# Patient Record
Sex: Male | Born: 1985 | Race: Black or African American | Hispanic: No | Marital: Single
Health system: Southern US, Community
[De-identification: ages and names within clinical notes are randomized; demographics above are authoritative.]

---
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
## 2017-04-24 ENCOUNTER — Emergency Department (HOSPITAL_COMMUNITY)
Admission: EM | Admit: 2017-04-24 | Discharge: 2017-04-24 | Disposition: A | Discharge status: Home / Self Care | Payer: No Typology Code available for payment source | Attending: Emergency Medicine | Admitting: Emergency Medicine

## 2017-04-24 ENCOUNTER — Encounter (HOSPITAL_COMMUNITY): Payer: Self-pay | Admitting: Emergency Medicine

## 2017-04-24 ENCOUNTER — Emergency Department (HOSPITAL_COMMUNITY): Payer: No Typology Code available for payment source

## 2017-04-24 DIAGNOSIS — Y9389 Activity, other specified: Secondary | ICD-10-CM | POA: Insufficient documentation

## 2017-04-24 DIAGNOSIS — Y999 Unspecified external cause status: Secondary | ICD-10-CM | POA: Diagnosis not present

## 2017-04-24 DIAGNOSIS — S161XXA Strain of muscle, fascia and tendon at neck level, initial encounter: Secondary | ICD-10-CM | POA: Diagnosis not present

## 2017-04-24 DIAGNOSIS — Y92410 Unspecified street and highway as the place of occurrence of the external cause: Secondary | ICD-10-CM | POA: Diagnosis not present

## 2017-04-24 DIAGNOSIS — S199XXA Unspecified injury of neck, initial encounter: Secondary | ICD-10-CM | POA: Diagnosis present

## 2017-04-24 MED ORDER — CYCLOBENZAPRINE HCL 10 MG PO TABS: 10.0000 mg | ORAL_TABLET | Freq: Two times a day (BID) | ORAL | 0 refills | Status: DC | PRN

## 2017-04-24 MED ORDER — DICLOFENAC SODIUM 50 MG PO TBEC: 50.0000 mg | DELAYED_RELEASE_TABLET | Freq: Two times a day (BID) | ORAL | 0 refills | Status: DC
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
## 2017-04-24 NOTE — Discharge Instructions (Signed)
Take the medication as directed. Do not take the muscle relaxant if driving as it will make you sleepy. Return as needed for any problems.

## 2017-04-25 ENCOUNTER — Emergency Department (HOSPITAL_COMMUNITY)
Admission: EM | Admit: 2017-04-25 | Discharge: 2017-04-25 | Disposition: A | Discharge status: Home / Self Care | Payer: Self-pay | Attending: Emergency Medicine | Admitting: Emergency Medicine

## 2017-04-25 ENCOUNTER — Encounter (HOSPITAL_COMMUNITY): Payer: Self-pay | Admitting: Emergency Medicine

## 2017-04-25 DIAGNOSIS — S56912A Strain of unspecified muscles, fascia and tendons at forearm level, left arm, initial encounter: Secondary | ICD-10-CM | POA: Insufficient documentation

## 2017-04-25 DIAGNOSIS — Y999 Unspecified external cause status: Secondary | ICD-10-CM | POA: Insufficient documentation

## 2017-04-25 DIAGNOSIS — S29012A Strain of muscle and tendon of back wall of thorax, initial encounter: Secondary | ICD-10-CM | POA: Insufficient documentation

## 2017-04-25 DIAGNOSIS — Y92410 Unspecified street and highway as the place of occurrence of the external cause: Secondary | ICD-10-CM | POA: Insufficient documentation

## 2017-04-25 DIAGNOSIS — I10 Essential (primary) hypertension: Secondary | ICD-10-CM | POA: Insufficient documentation

## 2017-04-25 DIAGNOSIS — S56911A Strain of unspecified muscles, fascia and tendons at forearm level, right arm, initial encounter: Secondary | ICD-10-CM | POA: Insufficient documentation

## 2017-04-25 DIAGNOSIS — Y939 Activity, unspecified: Secondary | ICD-10-CM | POA: Insufficient documentation

## 2017-04-25 DIAGNOSIS — T07XXXA Unspecified multiple injuries, initial encounter: Secondary | ICD-10-CM

## 2017-04-25 MED ORDER — IBUPROFEN 800 MG PO TABS
800.0000 mg | ORAL_TABLET | Freq: Once | ORAL | Status: AC
  Administered 2017-04-25: 800 mg via ORAL
  Filled 2017-04-25: qty 1

## 2017-04-25 NOTE — Discharge Instructions (Signed)
Please drink plenty of fluids.  Take ibuprofen as needed for muscle aches.  Have your blood pressure rechecked with a primary care doctor next week.
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
## 2017-04-27 ENCOUNTER — Emergency Department (HOSPITAL_COMMUNITY)
Admission: EM | Admit: 2017-04-27 | Discharge: 2017-04-27 | Disposition: A | Discharge status: Home / Self Care | Payer: Self-pay | Attending: Emergency Medicine | Admitting: Emergency Medicine

## 2017-04-27 ENCOUNTER — Encounter (HOSPITAL_COMMUNITY): Payer: Self-pay | Admitting: Emergency Medicine

## 2017-04-27 DIAGNOSIS — M542 Cervicalgia: Secondary | ICD-10-CM | POA: Insufficient documentation

## 2017-04-27 DIAGNOSIS — M549 Dorsalgia, unspecified: Secondary | ICD-10-CM | POA: Insufficient documentation

## 2017-04-27 DIAGNOSIS — M25531 Pain in right wrist: Secondary | ICD-10-CM | POA: Insufficient documentation

## 2017-04-27 NOTE — ED Triage Notes (Signed)
[facility]. Patient c/o muscular pain to left side of neck with movement and c/o pain to right wrist. A&OX4 in triage

## 2017-04-27 NOTE — ED Notes (Signed)
Patient given discharge instruction, verbalized understand. Patient ambulatory out of the department.  
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
## 2017-11-13 ENCOUNTER — Encounter (HOSPITAL_COMMUNITY): Payer: Self-pay | Admitting: Emergency Medicine

## 2017-11-13 ENCOUNTER — Emergency Department (HOSPITAL_COMMUNITY): Payer: Self-pay

## 2017-11-13 ENCOUNTER — Other Ambulatory Visit: Payer: Self-pay

## 2017-11-13 ENCOUNTER — Emergency Department (HOSPITAL_COMMUNITY)
Admission: EM | Admit: 2017-11-13 | Discharge: 2017-11-13 | Disposition: A | Discharge status: Home / Self Care | Payer: Self-pay | Attending: Emergency Medicine | Admitting: Emergency Medicine

## 2017-11-13 DIAGNOSIS — Y929 Unspecified place or not applicable: Secondary | ICD-10-CM | POA: Insufficient documentation

## 2017-11-13 DIAGNOSIS — S96901A Unspecified injury of unspecified muscle and tendon at ankle and foot level, right foot, initial encounter: Secondary | ICD-10-CM | POA: Insufficient documentation

## 2017-11-13 DIAGNOSIS — W1789XA Other fall from one level to another, initial encounter: Secondary | ICD-10-CM | POA: Insufficient documentation

## 2017-11-13 DIAGNOSIS — Y9389 Activity, other specified: Secondary | ICD-10-CM | POA: Insufficient documentation

## 2017-11-13 DIAGNOSIS — Y998 Other external cause status: Secondary | ICD-10-CM | POA: Insufficient documentation

## 2017-11-13 DIAGNOSIS — Z79899 Other long term (current) drug therapy: Secondary | ICD-10-CM | POA: Insufficient documentation

## 2017-11-13 DIAGNOSIS — S66801A Unspecified injury of other specified muscles, fascia and tendons at wrist and hand level, right hand, initial encounter: Secondary | ICD-10-CM

## 2017-11-13 MED ORDER — IBUPROFEN 600 MG PO TABS: 600.0000 mg | ORAL_TABLET | Freq: Four times a day (QID) | ORAL | 0 refills | Status: DC

## 2017-11-13 MED ORDER — ONDANSETRON HCL 4 MG PO TABS
4.0000 mg | ORAL_TABLET | Freq: Once | ORAL | Status: AC
  Administered 2017-11-13: 4 mg via ORAL
  Filled 2017-11-13: qty 1

## 2017-11-13 MED ORDER — HYDROCODONE-ACETAMINOPHEN 5-325 MG PO TABS
2.0000 | ORAL_TABLET | Freq: Once | ORAL | Status: AC
  Administered 2017-11-13: 2 via ORAL
  Filled 2017-11-13: qty 2

## 2017-11-13 MED ORDER — HYDROCODONE-ACETAMINOPHEN 5-325 MG PO TABS: 1.0000 | ORAL_TABLET | ORAL | 0 refills | Status: AC | PRN

## 2017-11-13 MED ORDER — IBUPROFEN 800 MG PO TABS
800.0000 mg | ORAL_TABLET | Freq: Once | ORAL | Status: AC
  Administered 2017-11-13: 800 mg via ORAL
  Filled 2017-11-13: qty 1

## 2017-11-13 NOTE — Discharge Instructions (Signed)
Your x-ray questions ligament injury of the right ankle.  You have a joint effusion (fluid in the joint) and there is question of a deformity in the back of your ankle that may be the way your growth plates came together, or may be a subtle fracture.  Please use your ankle stirrup splint until seen by the orthopedic specialist.  You do not have to sleep in this device.  Please keep your foot elevated above your waist when you are sitting and above your heart when you are lying down to control the swelling.  Please use ibuprofen with breakfast, lunch, dinner, and at bedtime for swelling and inflammation.  May use hydrocodone for more severe pain.This medication may cause drowsiness. Please do not drink, drive, or participate in activity that requires concentration while taking this medication.  Please use your crutches until you can safely apply weight to your right lower extremity.  Please see Dr. Romeo AppleHarrison as soon as possible for orthopedic evaluation of your right ankle.

## 2017-11-13 NOTE — ED Triage Notes (Signed)
Pt c/o right ankle pain after jumping a fence x one week ago.

## 2017-11-13 NOTE — ED Provider Notes (Signed)
Bryan Morris   CSN: [id number] Arrival date & time: 11/13/17  1931     History   Chief Complaint Chief Complaint  Patient presents with  . Ankle Pain    HPI Bryan Morris is a 32 y.o. male.  Patient is a 32 year old male who presents to the emergency department with a complaint of right ankle pain and swelling.  The patient states that on Thursday, March 14 he jumped over a fence, landed wrong, and has been having pain and swelling since that time.  The patient states that he was evaluated by EMS, and told that he may have a sprain, but nothing significant going on.  The patient states that since that time he has been having more and more and more swelling, and now he cannot put weight on it without severe pain.  No other injury reported at this time.  Patient denies knee or hip pain on the right.      History reviewed. No pertinent past medical history.  There are no active problems to display for this patient.   History reviewed. No pertinent surgical history.     Home Medications    Prior to Admission medications   Medication Sig Start Date End Date Taking? Authorizing Provider  cyclobenzaprine (FLEXERIL) 10 MG tablet Take 1 tablet (10 mg total) by mouth 2 (two) times daily as needed for muscle spasms. 04/24/17   Bryan Napoleon, Bryan Morris  diclofenac (VOLTAREN) 50 MG EC tablet Take 1 tablet (50 mg total) by mouth 2 (two) times daily. 04/24/17   Bryan Napoleon, Bryan Morris    Family History History reviewed. No pertinent family history.  Social History Social History   Tobacco Use  . Smoking status: Never Smoker  . Smokeless tobacco: Never Used  Substance Use Topics  . Alcohol use: Yes  . Drug use: Yes    Frequency: 2.0 times per week    Types: Marijuana     Allergies   Patient has no known allergies.   Review of Systems Review of Systems  Constitutional: Negative for activity change.       All ROS Neg except as noted in HPI  HENT:  Negative for nosebleeds.   Eyes: Negative for photophobia and discharge.  Respiratory: Negative for cough, shortness of breath and wheezing.   Cardiovascular: Negative for chest pain and palpitations.  Gastrointestinal: Negative for abdominal pain and blood in stool.  Genitourinary: Negative for dysuria, frequency and hematuria.  Musculoskeletal: Positive for arthralgias. Negative for back pain and neck pain.       Ankle pain and swelling  Skin: Negative.   Neurological: Negative for dizziness, seizures and speech difficulty.  Psychiatric/Behavioral: Negative for confusion and hallucinations.     Physical Exam Updated Vital Signs BP 131/70   Pulse 87   Temp 98.6 F (37 C)   Resp 18   Ht 5\' 8"  (1.727 m)   Wt 98.9 kg (218 lb)   SpO2 100%   BMI 33.15 kg/m   Physical Exam  Musculoskeletal:       Right foot: There is decreased range of motion, tenderness and swelling. There is normal capillary refill.  There is full range of motion of the right hip and right knee.  There is no palpable deformity of the tibial area.  There is swelling of the right ankle.  There is bruising noted of the medial aspect of the right ankle.  There is tenderness to the dorsum of the foot  extending into the ankle, and there is tenderness to the posterior ankle.  The Achilles tendon is intact.  Capillary refill is less than 2 seconds.  Dorsalis pedis and posterior tibial pulses are 2+. Range of motion of the ankle is not adequately tested at this time due to pain and swelling.     ED Treatments / Results  Labs (all labs ordered are listed, but only abnormal results are displayed) Labs Reviewed - No data to display  EKG  EKG Interpretation None       Radiology Dg Ankle Complete Right  Result Date: 11/13/2017 CLINICAL DATA:  Jumped over a fence and landed wrong, with swelling, bruising and pain at medial malleolus, injury on 11/07/2017 EXAM: RIGHT ANKLE - COMPLETE 3+ VIEW COMPARISON:  None FINDINGS:  Diffuse soft tissue swelling RIGHT ankle greatest medially. Small rounded ossicle adjacent to tip of lateral malleolus, appears corticated/old. Widening of the medial joint line question ligamentous injury. Cortical irregularity a at the posterior margin of the distal tibia, question subtle posterior malleolar fracture versus developmental anomaly at site of closure of the distal tibial physeal plate. Small ankle joint effusion present. No additional fracture or dislocation. IMPRESSION: Soft tissue swelling with small ankle joint effusion. Widening of medial joint line question ligamentous injury. Questionable subtle fracture versus developmental anomaly at the posterior margin of the distal tibia. Electronically Signed   By: Ulyses SouthwardMark  Boles M.D.   On: 11/13/2017 20:05    Procedures Procedures (including critical care time)  Medications Ordered in ED Medications - No data to display   Initial Impression / Assessment and Plan / ED Course  I have reviewed the triage vital signs and the nursing notes.  Pertinent labs & imaging results that were available during my care of the patient were reviewed by me and considered in my medical decision making (see chart for details).       Final Clinical Impressions(s) / ED Diagnoses MDM  Patient sustained injury to the right ankle on March 14.  The patient states that he has been having continued pain and swelling since that time, and now he cannot put weight on that lower extremity without severe pain.  The x-ray shows soft tissue swelling with small joint effusion present.  There is some widening of the medial joint line with the question of ligamentous injury.  There is also question of a subtle fracture versus a developmental abnormality at the posterior margin of the distal tibia.  There are no neurovascular deficits appreciated at this time.  Patient fitted with an ankle stirrup splint and crutches.  I have discussed with him the importance of keeping  his ankle elevated above his waist when sitting and above his heart when lying down.  He will use ibuprofen 4 times daily and Norco every 6 hours as needed for more severe pain.  He is referred to Dr. Romeo AppleHarrison for orthopedic evaluation of this possible ligamentous injury and questionable posterior tibial fracture.  The patient is in agreement with this plan.   Final diagnoses:  Intermetacarpal ligament injury, right, initial encounter    ED Discharge Orders        Ordered    ibuprofen (ADVIL,MOTRIN) 600 MG tablet  4 times daily     11/13/17 2025    HYDROcodone-acetaminophen (NORCO/VICODIN) 5-325 MG tablet  Every 4 hours PRN     11/13/17 2025       Bryan Morris, Bryan Buch, Bryan Morris 11/13/17 2033    Bryan Morris, Bryan CowerJason, Bryan Morris 11/13/17 2225

## 2017-11-14 ENCOUNTER — Telehealth: Payer: Self-pay | Admitting: Orthopedic Surgery

## 2017-11-14 NOTE — Telephone Encounter (Signed)
Called back to patient; scheduled. Patient aware.

## 2017-11-14 NOTE — Telephone Encounter (Signed)
Patient called [facility] visit yesterday, 11/13/17 regarding problem of intrametacarpal ligament injury, right ankle. Please review and advise regarding scheduling appointment, based on current schedule. [phone]

## 2017-11-14 NOTE — Telephone Encounter (Signed)
Next open appt for new problem

## 2017-11-21 ENCOUNTER — Encounter: Payer: Self-pay | Admitting: Orthopedic Surgery

## 2017-11-21 ENCOUNTER — Ambulatory Visit (INDEPENDENT_AMBULATORY_CARE_PROVIDER_SITE_OTHER): Payer: Self-pay

## 2017-11-21 ENCOUNTER — Ambulatory Visit: Payer: Self-pay | Admitting: Orthopedic Surgery

## 2017-11-21 VITALS — BP 134/87 | HR 70 | Ht 68.0 in | Wt 215.0 lb

## 2017-11-21 DIAGNOSIS — M25571 Pain in right ankle and joints of right foot: Secondary | ICD-10-CM

## 2017-11-21 DIAGNOSIS — S93491A Sprain of other ligament of right ankle, initial encounter: Secondary | ICD-10-CM

## 2017-11-21 DIAGNOSIS — S93431A Sprain of tibiofibular ligament of right ankle, initial encounter: Secondary | ICD-10-CM

## 2017-11-21 MED ORDER — IBUPROFEN 600 MG PO TABS
600.0000 mg | ORAL_TABLET | Freq: Four times a day (QID) | ORAL | 0 refills | Status: AC
Start: 2017-11-21 — End: ?

## 2017-11-21 NOTE — Patient Instructions (Signed)
No weightbearing next 4 weeks and then start walking if having pain or problems call us back

## 2017-11-21 NOTE — Progress Notes (Signed)
NEW PATIENT OFFICE VISIT   Chief Complaint  Patient presents with  . Ankle Injury    jumped a fence twisted right ankle 11/07/17     32 year old male jumped over a fence twisted his ankle presents for evaluation  He complains of mild right ankle pain 14 days associated with swelling which is come down with ankle wrap and crutches       Review of Systems  Constitutional: Negative for fever.  Skin: Negative.   Neurological: Negative.      History reviewed. No pertinent past medical history. No history of hypertension or diabetes History reviewed. No pertinent surgical history. Denies any history of surgery History reviewed. No pertinent family history. Social History   Tobacco Use  . Smoking status: Never Smoker  . Smokeless tobacco: Never Used  Substance Use Topics  . Alcohol use: Yes  . Drug use: Yes    Frequency: 2.0 times per week    Types: Marijuana   No Known Allergies   Current Meds  Medication Sig  . HYDROcodone-acetaminophen (NORCO/VICODIN) 5-325 MG tablet Take 1 tablet by mouth every 4 (four) hours as needed.  Marland Kitchen. ibuprofen (ADVIL,MOTRIN) 600 MG tablet Take 1 tablet (600 mg total) by mouth 4 (four) times daily.    BP 134/87   Pulse 70   Ht 5\' 8"  (1.727 m)   Wt 215 lb (97.5 kg)   BMI 32.69 kg/m   Physical Exam  Constitutional: He is oriented to person, place, and time. He appears well-developed and well-nourished.  Vital signs have been reviewed and are stable. Gen. appearance the patient is well-developed and well-nourished with normal grooming and hygiene.   Neurological: He is alert and oriented to person, place, and time.  Skin: Skin is warm and dry. No erythema.  Psychiatric: He has a normal mood and affect.  Vitals reviewed.   Right Ankle Exam   Tenderness  Right ankle tenderness location: Tenderness over the deltoid ligament anterior talofibular ligament and anterior tibia fibular ligament. Swelling: mild  Range of Motion   Dorsiflexion: normal  Plantar flexion: normal   Muscle Strength  Dorsiflexion:  5/5 Plantar flexion:  5/5  Tests  Anterior drawer: trace (External rotation test was normal) Varus tilt: negative  Other  Erythema: absent Scars: absent Sensation: normal Pulse: present    Left Ankle Exam   Tenderness  The patient is experiencing no tenderness.  Swelling: none  Range of Motion  Dorsiflexion: normal  Plantar flexion: normal   Muscle Strength  The patient has normal left ankle strength.  Other  Erythema: absent Scars: absent Sensation: normal Pulse: present      MEDICAL DECISION SECTION  xrays ordered?  Yes  My independent reading of xrays: The hospital took an ankle x-ray they pointed to a posterior irregularity in the tibia however, the ankle mortise is poorly rotated and shows medial joint space widening as well as a large gap in the distal tibia and fibula so I am repeating it  X-ray on repeat shows 4 mm medial clear space but there is widening at the distal tib-fib area I think this is his normal anatomy   Encounter Diagnoses  Name Primary?  . Acute right ankle pain Yes  . High ankle sprain of right lower extremity, initial encounter      PLAN:   Meds ordered this encounter  Medications  . ibuprofen (ADVIL,MOTRIN) 600 MG tablet    Sig: Take 1 tablet (600 mg total) by mouth 4 (four) times daily.  Dispense:  56 tablet    Refill:  0     No weightbearing for 4 weeks then start weightbearing if still having pain or problems he will call us back as he is self-pay and we are trying to save him some money

## 2020-01-30 ENCOUNTER — Other Ambulatory Visit: Payer: Self-pay

## 2020-01-30 ENCOUNTER — Emergency Department (HOSPITAL_COMMUNITY)
Admission: EM | Admit: 2020-01-30 | Discharge: 2020-01-30 | Disposition: A | Discharge status: Home / Self Care | Payer: Self-pay | Attending: Emergency Medicine | Admitting: Emergency Medicine

## 2020-01-30 ENCOUNTER — Encounter (HOSPITAL_COMMUNITY): Payer: Self-pay | Admitting: Emergency Medicine

## 2020-01-30 DIAGNOSIS — B369 Superficial mycosis, unspecified: Secondary | ICD-10-CM

## 2020-01-30 DIAGNOSIS — Z79899 Other long term (current) drug therapy: Secondary | ICD-10-CM | POA: Insufficient documentation

## 2020-01-30 DIAGNOSIS — R21 Rash and other nonspecific skin eruption: Secondary | ICD-10-CM | POA: Insufficient documentation

## 2020-01-30 MED ORDER — CLOTRIMAZOLE 1 % EX CREA: TOPICAL_CREAM | CUTANEOUS | 0 refills | Status: AC

## 2020-01-30 NOTE — ED Provider Notes (Signed)
[facility] EMERGENCY DEPARTMENT Provider Note   CSN: [id number] Arrival date & time: 01/30/20  1445     History Chief Complaint  Patient presents with  . Rash    Bryan Morris is a 34 y.o. male.  HPI      Bryan Morris is a 34 y.o. male who presents to the Emergency Department complaining of intermittent itching and rash to both arms.  Symptoms have been waxing and waning for over 1 year.  States he is used over-the-counter creams and ointments without relief.  He has not sought medical care for this prior to today.  Nothing seems to make his symptoms better or worse.  He denies swelling, redness, fever or chills.   History reviewed. No pertinent past medical history.  There are no problems to display for this patient.   History reviewed. No pertinent surgical history.     History reviewed. No pertinent family history.  Social History   Tobacco Use  . Smoking status: Never Smoker  . Smokeless tobacco: Never Used  Substance Use Topics  . Alcohol use: Yes  . Drug use: Yes    Frequency: 2.0 times per week    Types: Marijuana    Home Medications Prior to Admission medications   Medication Sig Start Date End Date Taking? Authorizing Provider  HYDROcodone-acetaminophen (NORCO/VICODIN) 5-325 MG tablet Take 1 tablet by mouth every 4 (four) hours as needed. 11/13/17   Lily Kocher, PA-C  ibuprofen (ADVIL,MOTRIN) 600 MG tablet Take 1 tablet (600 mg total) by mouth 4 (four) times daily. 11/21/17   Carole Civil, MD    Allergies    Patient has no known allergies.  Review of Systems   Review of Systems  Constitutional: Negative for activity change, appetite change, chills and fever.  HENT: Negative for facial swelling, sore throat and trouble swallowing.   Respiratory: Negative for chest tightness and shortness of breath.   Gastrointestinal: Negative for nausea and vomiting.  Musculoskeletal: Negative for arthralgias, myalgias, neck pain and neck stiffness.    Skin: Positive for rash. Negative for wound.  Neurological: Negative for dizziness, weakness, numbness and headaches.    Physical Exam Updated Vital Signs BP (!) 110/91 (BP Location: Right Arm)   Pulse (!) 59   Temp 98.4 F (36.9 C) (Oral)   Resp 18   Ht 5\' 8"  (1.727 m)   Wt 87.5 kg   SpO2 99%   BMI 29.35 kg/m   Physical Exam Vitals and nursing note reviewed.  Constitutional:      General: He is not in acute distress.    Appearance: Normal appearance.  Cardiovascular:     Rate and Rhythm: Normal rate and regular rhythm.     Pulses: Normal pulses.  Pulmonary:     Effort: Pulmonary effort is normal.     Breath sounds: Normal breath sounds.  Skin:    General: Skin is warm.     Capillary Refill: Capillary refill takes less than 2 seconds.     Findings: Rash present.     Comments: Scattered, maculopapular rash to the bilateral arms.  No erythema.  Slight scaling noted.  No edema or pustules.  Neurological:     General: No focal deficit present.     Mental Status: He is alert.     Sensory: No sensory deficit.     Motor: No weakness.     ED Results / Procedures / Treatments   Labs (all labs ordered are listed, but only abnormal results  are displayed) Labs Reviewed - No data to display  EKG None  Radiology No results found.  Procedures Procedures (including critical care time)  Medications Ordered in ED Medications - No data to display  ED Course  I have reviewed the triage vital signs and the nursing notes.  Pertinent labs & imaging results that were available during my care of the patient were reviewed by me and considered in my medical decision making (see chart for details).    MDM Rules/Calculators/A&P                      Well-appearing patient with waxing and waning rash to bilateral arms for greater than 1 year.  Rash appears minimal.  No concerning symptoms for infectious process.  Likely fungal.  Will treat with prescription antifungal cream,  referral information provided for dermatology.   Final Clinical Impression(s) / ED Diagnoses Final diagnoses:  None    Rx / DC Orders ED Discharge Orders    None       Pauline Aus, PA-C 01/30/20 Trenton Gammon, MD 02/01/20 [phone]

## 2020-01-30 NOTE — ED Triage Notes (Signed)
Pt c/o of " rash" in bend of his arms x 2 years

## 2020-01-30 NOTE — Discharge Instructions (Addendum)
Apply the cream as directed.  You will likely need to treat for at least 2 weeks.  Take over-the-counter Benadryl capsules, 1 capsule every 4-6 hours if needed for itching.  You may follow-up with the dermatologist listed if needed.  Call the office for an appointment.

## 2020-05-24 ENCOUNTER — Ambulatory Visit: Admission: EM | Admit: 2020-05-24 | Discharge: 2020-05-24 | Discharge status: Absent without leave | Payer: Self-pay

## 2020-09-16 ENCOUNTER — Ambulatory Visit
Admission: EM | Admit: 2020-09-16 | Discharge: 2020-09-16 | Disposition: A | Discharge status: Home / Self Care | Payer: Self-pay | Attending: Emergency Medicine | Admitting: Emergency Medicine

## 2020-09-16 ENCOUNTER — Other Ambulatory Visit: Payer: Self-pay

## 2020-09-16 ENCOUNTER — Encounter: Payer: Self-pay | Admitting: Emergency Medicine

## 2020-09-16 DIAGNOSIS — Z202 Contact with and (suspected) exposure to infections with a predominantly sexual mode of transmission: Secondary | ICD-10-CM | POA: Insufficient documentation

## 2020-09-16 DIAGNOSIS — R350 Frequency of micturition: Secondary | ICD-10-CM | POA: Insufficient documentation

## 2020-09-16 LAB — POCT URINALYSIS DIP (MANUAL ENTRY)
Bilirubin, UA: NEGATIVE
Glucose, UA: NEGATIVE mg/dL
Ketones, POC UA: NEGATIVE mg/dL
Leukocytes, UA: NEGATIVE
Nitrite, UA: NEGATIVE
Protein Ur, POC: 100 mg/dL — AB
Spec Grav, UA: >=1.03 — AB (ref 1.010–1.025)
Urobilinogen, UA: 0.2 E.U./dL
pH, UA: 6 (ref 5.0–8.0)

## 2020-09-16 NOTE — ED Provider Notes (Signed)
[facility] CARE CENTER   [id number] 09/16/20 Arrival Time: 0835   Chief Complaint  Patient presents with  . STD screening     SUBJECTIVE:  Bryan Morris is a 35 y.o. male who who presented to the urgent care with a complaint of frequent urination for the past few days.  He is not sure if he was exposed to an STD or not.  Reports recent sexual encounter.  Patient is sexually active with multiple male partners.  Denies penile discharge.  He has tried OTC medications without relief.  Reports worsening symptoms with urination.  Report similar symptoms in the past.  Denies fever, chills, nausea, vomiting, abdominal or pelvic pain, urinary symptoms, penile rashes or lesions.   No LMP for male patient. Current birth control method: Compliant with BC:  ROS: As per HPI.  All other pertinent ROS negative.     History reviewed. No pertinent past medical history. History reviewed. No pertinent surgical history. No Known Allergies No current facility-administered medications on file prior to encounter.   Current Outpatient Medications on File Prior to Encounter  Medication Sig Dispense Refill  . clotrimazole (LOTRIMIN) 1 % cream Apply to affected area 2 times daily.  Will need to treat for at least 2 weeks 45 g 0  . HYDROcodone-acetaminophen (NORCO/VICODIN) 5-325 MG tablet Take 1 tablet by mouth every 4 (four) hours as needed. 12 tablet 0  . ibuprofen (ADVIL,MOTRIN) 600 MG tablet Take 1 tablet (600 mg total) by mouth 4 (four) times daily. 56 tablet 0    Social History   Socioeconomic History  . Marital status: Single    Spouse name: Not on file  . Number of children: Not on file  . Years of education: Not on file  . Highest education level: Not on file  Occupational History  . Not on file  Tobacco Use  . Smoking status: Never Smoker  . Smokeless tobacco: Never Used  Vaping Use  . Vaping Use: Never used  Substance and Sexual Activity  . Alcohol use: Yes  . Drug use: Yes     Frequency: 2.0 times per week    Types: Marijuana  . Sexual activity: Not on file  Other Topics Concern  . Not on file  Social History Narrative  . Not on file   Social Determinants of Health   Financial Resource Strain: Not on file  Food Insecurity: Not on file  Transportation Needs: Not on file  Physical Activity: Not on file  Stress: Not on file  Social Connections: Not on file  Intimate Partner Violence: Not on file   No family history on file.  OBJECTIVE:  Vitals:   09/16/20 0840 09/16/20 0841  BP: (!) 151/91   Pulse: 67   Resp: 16   Temp: 98.3 F (36.8 C)   TempSrc: Oral   SpO2: 97%   Weight:  205 lb (93 kg)  Height:  5\' 8"  (1.727 m)     General appearance: Alert, NAD, appears stated age Head: NCAT Throat: lips, mucosa, and tongue normal; teeth and gums normal Lungs: CTA bilaterally without adventitious breath sounds Heart: regular rate and rhythm.  Radial pulses 2+ symmetrical bilaterally Back: no CVA tenderness Abdomen: soft, non-tender; bowel sounds normal; no masses or organomegaly; no guarding or rebound tenderness GU: penile self swab obtained Skin: warm and dry Psychological:  Alert and cooperative. Normal mood and affect.  LABS:  Results for orders placed or performed during the hospital encounter of 09/16/20  POCT urinalysis dipstick  Result Value Ref Range   Color, UA yellow yellow   Clarity, UA clear clear   Glucose, UA negative negative mg/dL   Bilirubin, UA negative negative   Ketones, POC UA negative negative mg/dL   Spec Grav, UA >=8.850 (A) 1.010 - 1.025   Blood, UA trace-lysed (A) negative   pH, UA 6.0 5.0 - 8.0   Protein Ur, POC =100 (A) negative mg/dL   Urobilinogen, UA 0.2 0.2 or 1.0 E.U./dL   Nitrite, UA Negative Negative   Leukocytes, UA Negative Negative    Labs Reviewed  POCT URINALYSIS DIP (MANUAL ENTRY) - Abnormal; Notable for the following components:      Result Value   Spec Grav, UA >=1.030 (*)    Blood, UA  trace-lysed (*)    Protein Ur, POC =100 (*)    All other components within normal limits  URINE CULTURE  CYTOLOGY, (ORAL, ANAL, URETHRAL) ANCILLARY ONLY    ASSESSMENT & PLAN:  1. STD exposure   2. Frequent urination     No orders of the defined types were placed in this encounter.   Pending: Labs Reviewed  POCT URINALYSIS DIP (MANUAL ENTRY) - Abnormal; Notable for the following components:      Result Value   Spec Grav, UA >=1.030 (*)    Blood, UA trace-lysed (*)    Protein Ur, POC =100 (*)    All other components within normal limits  URINE CULTURE  CYTOLOGY, (ORAL, ANAL, URETHRAL) ANCILLARY ONLY   Discharge instructions  Urine analysis was negative for UTI.  Sample be sent for culture and someone will call if your result is abnormal Penile  self-swab obtained.  We will follow up with you regarding abnormal results If tests results are positive, please abstain from sexual activity until you and your partner(s) have been treated Follow up with PCP or [facility] if symptoms persists Return here or go to ER if you have any new or worsening symptoms fever, chills, nausea, vomiting, abdominal or pelvic pain, painful intercourse, vaginal discharge, vaginal bleeding, persistent symptoms despite treatment, etc...  Reviewed expectations re: course of current medical issues. Questions answered. Outlined signs and symptoms indicating need for more acute intervention. Patient verbalized understanding. After Visit Summary given.       Durward Parcel, FNP 09/16/20 [phone]

## 2020-09-16 NOTE — ED Triage Notes (Signed)
States he was exposed to an STI.  Frequent urination.

## 2020-09-16 NOTE — Discharge Instructions (Addendum)
Urine analysis was negative for UTI.  Sample be sent for culture and someone will call if your result is abnormal Penile  self-swab obtained.  We will follow up with you regarding abnormal results If tests results are positive, please abstain from sexual activity until you and your partner(s) have been treated Follow up with PCP or [facility] if symptoms persists Return here or go to ER if you have any new or worsening symptoms fever, chills, nausea, vomiting, abdominal or pelvic pain, painful intercourse, vaginal discharge, vaginal bleeding, persistent symptoms despite treatment, etc..Marland Kitchen

## 2020-09-18 ENCOUNTER — Telehealth (HOSPITAL_COMMUNITY): Payer: Self-pay | Admitting: Emergency Medicine

## 2020-09-18 LAB — CYTOLOGY, (ORAL, ANAL, URETHRAL) ANCILLARY ONLY
Chlamydia: NEGATIVE
Comment: NEGATIVE
Comment: NEGATIVE
Comment: NORMAL
Neisseria Gonorrhea: NEGATIVE
Trichomonas: POSITIVE — AB

## 2020-09-18 LAB — URINE CULTURE: Culture: NO GROWTH

## 2020-09-18 MED ORDER — METRONIDAZOLE 500 MG PO TABS: 500.0000 mg | ORAL_TABLET | Freq: Two times a day (BID) | ORAL | 0 refills | Status: AC

## 2021-02-15 ENCOUNTER — Emergency Department (HOSPITAL_COMMUNITY): Payer: Medicaid Other

## 2021-02-15 ENCOUNTER — Inpatient Hospital Stay (HOSPITAL_COMMUNITY)
Admission: EM | Admit: 2021-02-15 | Discharge: 2021-02-26 | DRG: 084 | Disposition: E | Payer: Medicaid Other | Attending: General Surgery | Admitting: General Surgery

## 2021-02-15 ENCOUNTER — Encounter (HOSPITAL_COMMUNITY): Payer: Self-pay | Admitting: General Surgery

## 2021-02-15 DIAGNOSIS — T07XXXA Unspecified multiple injuries, initial encounter: Secondary | ICD-10-CM

## 2021-02-15 DIAGNOSIS — S0104XA Puncture wound with foreign body of scalp, initial encounter: Secondary | ICD-10-CM | POA: Diagnosis present

## 2021-02-15 DIAGNOSIS — Z20822 Contact with and (suspected) exposure to covid-19: Secondary | ICD-10-CM | POA: Diagnosis present

## 2021-02-15 DIAGNOSIS — S0231XA Fracture of orbital floor, right side, initial encounter for closed fracture: Secondary | ICD-10-CM | POA: Diagnosis present

## 2021-02-15 DIAGNOSIS — Y9289 Other specified places as the place of occurrence of the external cause: Secondary | ICD-10-CM

## 2021-02-15 DIAGNOSIS — W3400XA Accidental discharge from unspecified firearms or gun, initial encounter: Secondary | ICD-10-CM

## 2021-02-15 DIAGNOSIS — S41132A Puncture wound without foreign body of left upper arm, initial encounter: Secondary | ICD-10-CM | POA: Diagnosis present

## 2021-02-15 DIAGNOSIS — S065X9A Traumatic subdural hemorrhage with loss of consciousness of unspecified duration, initial encounter: Principal | ICD-10-CM | POA: Diagnosis present

## 2021-02-15 DIAGNOSIS — S31139A Puncture wound of abdominal wall without foreign body, unspecified quadrant without penetration into peritoneal cavity, initial encounter: Secondary | ICD-10-CM | POA: Diagnosis present

## 2021-02-15 DIAGNOSIS — S31824A Puncture wound with foreign body of left buttock, initial encounter: Secondary | ICD-10-CM | POA: Diagnosis present

## 2021-02-15 LAB — COMPREHENSIVE METABOLIC PANEL
ALT: 36 U/L (ref 0–44)
AST: 51 U/L — ABNORMAL HIGH (ref 15–41)
Albumin: 4 g/dL (ref 3.5–5.0)
Alkaline Phosphatase: 50 U/L (ref 38–126)
Anion gap: 23 — ABNORMAL HIGH (ref 5–15)
BUN: 10 mg/dL (ref 6–20)
CO2: 11 mmol/L — ABNORMAL LOW (ref 22–32)
Calcium: 8.9 mg/dL (ref 8.9–10.3)
Chloride: 101 mmol/L (ref 98–111)
Creatinine, Ser: 1.31 mg/dL — ABNORMAL HIGH (ref 0.61–1.24)
GFR, Estimated: >60 mL/min (ref >60)
Glucose, Bld: 282 mg/dL — ABNORMAL HIGH (ref 70–99)
Potassium: 3.6 mmol/L (ref 3.5–5.1)
Sodium: 135 mmol/L (ref 135–145)
Total Bilirubin: 1 mg/dL (ref 0.3–1.2)
Total Protein: 6.8 g/dL (ref 6.5–8.1)

## 2021-02-15 LAB — I-STAT CHEM 8, ED
BUN: 13 mg/dL (ref 6–20)
Calcium, Ion: 1.13 mmol/L — ABNORMAL LOW (ref 1.15–1.40)
Chloride: 104 mmol/L (ref 98–111)
Creatinine, Ser: 1.2 mg/dL (ref 0.61–1.24)
Glucose, Bld: 270 mg/dL — ABNORMAL HIGH (ref 70–99)
HCT: 39 % (ref 39.0–52.0)
Hemoglobin: 13.3 g/dL (ref 13.0–17.0)
Potassium: 4.3 mmol/L (ref 3.5–5.1)
Sodium: 138 mmol/L (ref 135–145)
TCO2: 15 mmol/L — ABNORMAL LOW (ref 22–32)

## 2021-02-15 LAB — CBC
HCT: 41 % (ref 39.0–52.0)
Hemoglobin: 13.4 g/dL (ref 13.0–17.0)
MCH: 30 pg (ref 26.0–34.0)
MCHC: 32.7 g/dL (ref 30.0–36.0)
MCV: 91.9 fL (ref 80.0–100.0)
Platelets: 326 10*3/uL (ref 150–400)
RBC: 4.46 MIL/uL (ref 4.22–5.81)
RDW: 12.1 % (ref 11.5–15.5)
WBC: 9.5 10*3/uL (ref 4.0–10.5)
nRBC: 0 % (ref 0.0–0.2)

## 2021-02-15 LAB — LACTIC ACID, PLASMA: Lactic Acid, Venous: >11 mmol/L (ref 0.5–1.9)

## 2021-02-15 LAB — RESP PANEL BY RT-PCR (FLU A&B, COVID) ARPGX2
Influenza A by PCR: NEGATIVE
Influenza B by PCR: NEGATIVE
SARS Coronavirus 2 by RT PCR: NEGATIVE

## 2021-02-15 LAB — PROTIME-INR
INR: 1.2 (ref 0.8–1.2)
Prothrombin Time: 15.2 seconds (ref 11.4–15.2)

## 2021-02-15 LAB — ETHANOL: Alcohol, Ethyl (B): <10 mg/dL (ref <10)

## 2021-02-15 MED ORDER — ETOMIDATE 2 MG/ML IV SOLN
20.0000 mg | Freq: Once | INTRAVENOUS | Status: AC
  Administered 2021-02-15: 20 mg via INTRAVENOUS

## 2021-02-15 MED ORDER — FENTANYL CITRATE (PF) 100 MCG/2ML IJ SOLN: 50.0000 ug | Freq: Once | INTRAMUSCULAR | Status: DC

## 2021-02-15 MED ORDER — FENTANYL 2500MCG IN NS 250ML (10MCG/ML) PREMIX INFUSION
50.0000 ug/h | INTRAVENOUS | Status: DC
  Filled 2021-02-15: qty 250

## 2021-02-15 MED ORDER — FENTANYL BOLUS VIA INFUSION
50.0000 ug | INTRAVENOUS | Status: DC | PRN
  Filled 2021-02-15: qty 100

## 2021-02-15 MED ORDER — ROCURONIUM BROMIDE 50 MG/5ML IV SOLN
100.0000 mg | Freq: Once | INTRAVENOUS | Status: AC
  Administered 2021-02-15: 100 mg via INTRAVENOUS

## 2021-02-15 MED ORDER — IOHEXOL 300 MG/ML  SOLN
100.0000 mL | Freq: Once | INTRAMUSCULAR | Status: AC | PRN
  Administered 2021-02-15: 100 mL via INTRAVENOUS

## 2021-02-16 ENCOUNTER — Encounter: Payer: Self-pay | Admitting: Emergency Medicine

## 2021-02-16 LAB — TYPE AND SCREEN
ABO/RH(D): B POS
Antibody Screen: NEGATIVE
Unit division: 0

## 2021-02-16 LAB — BPAM RBC
Blood Product Expiration Date: 202207062359
ISSUE DATE / TIME: 202206201321
Unit Type and Rh: 5100
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
## 2021-02-26 NOTE — ED Notes (Signed)
Organ procurement team notified.
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
## 2021-02-26 NOTE — ED Notes (Signed)
Patient time of death occurred at 16.
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
## 2021-02-26 NOTE — ED Notes (Signed)
Patient transported to CT w/ TRN ?

## 2021-02-26 NOTE — Progress Notes (Signed)
Pt transported to CT and back to TRA A without any complications.  

## 2021-02-26 DEATH — deceased
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
